# Patient Record
Sex: Male | Born: 1995 | Race: Black or African American | Hispanic: No | Marital: Single | Smoking: Light tobacco smoker
Health system: Southern US, Community
[De-identification: ages and names within clinical notes are randomized; demographics above are authoritative.]

---

## 2016-02-20 ENCOUNTER — Emergency Department (HOSPITAL_COMMUNITY): Payer: BC Managed Care – PPO

## 2016-02-20 ENCOUNTER — Emergency Department (HOSPITAL_COMMUNITY)
Admission: EM | Admit: 2016-02-20 | Discharge: 2016-02-20 | Disposition: A | Discharge status: Home / Self Care | Payer: BC Managed Care – PPO | Attending: Emergency Medicine | Admitting: Emergency Medicine

## 2016-02-20 ENCOUNTER — Encounter (HOSPITAL_COMMUNITY): Payer: Self-pay | Admitting: Emergency Medicine

## 2016-02-20 DIAGNOSIS — S01112A Laceration without foreign body of left eyelid and periocular area, initial encounter: Secondary | ICD-10-CM | POA: Diagnosis not present

## 2016-02-20 DIAGNOSIS — Y999 Unspecified external cause status: Secondary | ICD-10-CM | POA: Insufficient documentation

## 2016-02-20 DIAGNOSIS — Y929 Unspecified place or not applicable: Secondary | ICD-10-CM | POA: Diagnosis not present

## 2016-02-20 DIAGNOSIS — Y939 Activity, unspecified: Secondary | ICD-10-CM | POA: Diagnosis not present

## 2016-02-20 DIAGNOSIS — F17290 Nicotine dependence, other tobacco product, uncomplicated: Secondary | ICD-10-CM | POA: Diagnosis not present

## 2016-02-20 DIAGNOSIS — S0993XA Unspecified injury of face, initial encounter: Secondary | ICD-10-CM | POA: Diagnosis present

## 2016-02-20 DIAGNOSIS — S0512XA Contusion of eyeball and orbital tissues, left eye, initial encounter: Secondary | ICD-10-CM

## 2016-02-20 DIAGNOSIS — S20212A Contusion of left front wall of thorax, initial encounter: Secondary | ICD-10-CM | POA: Insufficient documentation

## 2016-02-20 MED ORDER — LIDOCAINE HCL 2 % IJ SOLN
10.0000 mL | Freq: Once | INTRAMUSCULAR | Status: AC
  Administered 2016-02-20: 200 mg
  Filled 2016-02-20: qty 20

## 2016-02-20 MED ORDER — IBUPROFEN 600 MG PO TABS: 600.0000 mg | ORAL_TABLET | Freq: Four times a day (QID) | ORAL | 0 refills | Status: AC | PRN

## 2016-02-20 MED ORDER — ACETAMINOPHEN 325 MG PO TABS
650.0000 mg | ORAL_TABLET | Freq: Once | ORAL | Status: AC
  Administered 2016-02-20: 650 mg via ORAL
  Filled 2016-02-20: qty 2
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
## 2016-02-20 NOTE — ED Notes (Signed)
[employer] to speak with pt.
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
## 2016-02-20 NOTE — ED Notes (Signed)
PA aware that pt does not want imaging done

## 2016-02-26 ENCOUNTER — Encounter (HOSPITAL_COMMUNITY): Payer: Self-pay | Admitting: *Deleted

## 2016-02-26 ENCOUNTER — Emergency Department (HOSPITAL_COMMUNITY)
Admission: EM | Admit: 2016-02-26 | Discharge: 2016-02-26 | Disposition: A | Discharge status: Home / Self Care | Payer: BC Managed Care – PPO | Attending: Emergency Medicine | Admitting: Emergency Medicine

## 2016-02-26 DIAGNOSIS — Z79899 Other long term (current) drug therapy: Secondary | ICD-10-CM | POA: Diagnosis not present

## 2016-02-26 DIAGNOSIS — Z4802 Encounter for removal of sutures: Secondary | ICD-10-CM | POA: Insufficient documentation

## 2016-02-26 DIAGNOSIS — F17290 Nicotine dependence, other tobacco product, uncomplicated: Secondary | ICD-10-CM | POA: Insufficient documentation

## 2016-02-26 NOTE — ED Provider Notes (Signed)
[facility]-EMERGENCY DEPT Provider Note   CSN: [id number] Arrival date & time: 02/26/16  1537  By signing my name below, I, Kenneth Zhang, attest that this documentation has been prepared under the direction and in the presence of [facility], PA-C.  Electronically Signed: Octavia Zhang, ED Scribe. 02/26/16. 4:05 PM.    History   Chief Complaint Chief Complaint  Patient presents with  . Suture / Staple Removal    The history is provided by the patient. No language interpreter was used.   HPI Comments: Kenneth Zhang is a 21 y.o. male who presents to the Emergency Department presenting for a suture removal. Pt had 4 sutures placed to his left eyebrow on 01/07. Pt expresses having intermittent headaches that have progressively been getting better. He notes the area has been healing well with no drainage, bleeding, or erythema from the area. He has been cleaning the area with rubbing alcohol but notes he has not had any complications with the healing of his laceration. Pt denies numbness or weakness in arms or legs and visual disturbance.  History reviewed. No pertinent past medical history.  There are no active problems to display for this patient.   History reviewed. No pertinent surgical history.     Home Medications    Prior to Admission medications   Medication Sig Start Date End Date Taking? Authorizing Provider  ibuprofen (ADVIL,MOTRIN) 600 MG tablet Take 1 tablet (600 mg total) by mouth every 6 (six) hours as needed. 02/20/16   Antony Madura, PA-C    Family History No family history on file.  Social History Social History  Substance Use Topics  . Smoking status: Light Tobacco Smoker    Types: Cigars  . Smokeless tobacco: Never Used  . Alcohol use No     Allergies   Patient has no known allergies.   Review of Systems Review of Systems  Constitutional: Negative for activity change, appetite change and fever.  Skin: Positive for wound. Negative for color change.    Allergic/Immunologic: Negative for immunocompromised state.  Neurological: Negative for weakness and numbness.  Hematological: Does not bruise/bleed easily.  Psychiatric/Behavioral: Negative for self-injury.     Physical Exam Updated Vital Signs BP 135/76 (BP Location: Right Arm)   Pulse 66   Temp 98.1 F (36.7 C) (Oral)   Resp 16   SpO2 100%   Physical Exam  Constitutional: He appears well-developed and well-nourished. No distress.  HENT:  Head: Normocephalic.  Mouth/Throat: Oropharynx is clear and moist. No oropharyngeal exudate.  Left eyebrow with healing laceration. 4 sutures removed by nurse. No erythema, edema, warmth, discharge, or tenderness  Eyes: Conjunctivae and EOM are normal. Right eye exhibits no discharge. Left eye exhibits no discharge.  Neck: Normal range of motion. Neck supple.  Cardiovascular: Normal rate and regular rhythm.   Pulmonary/Chest: Effort normal and breath sounds normal. No stridor. No respiratory distress. He has no wheezes. He has no rales.  Lymphadenopathy:    He has no cervical adenopathy.  Neurological: He is alert.  CN II-XII intact, EOMs intact, no pronator drift, grip strengths equal bilaterally; strength 5/5 in all extremities, sensation intact in all extremities; finger to nose, heel to shin, rapid alternating movements normal; gait is normal.   Skin: He is not diaphoretic.  Nursing note and vitals reviewed.    ED Treatments / Results  DIAGNOSTIC STUDIES: Oxygen Saturation is 100% on RA, normal by my interpretation.  COORDINATION OF CARE:  4:02 PM Discussed treatment plan with pt at bedside  and pt agreed to plan.  Labs (all labs ordered are listed, but only abnormal results are displayed) Labs Reviewed - No data to display  EKG  EKG Interpretation None       Radiology No results found.  Procedures Procedures (including critical care time)  Medications Ordered in ED Medications - No data to display   Initial  Impression / Assessment and Plan / ED Course  I have reviewed the triage vital signs and the nursing notes.  Pertinent labs & imaging results that were available during my care of the patient were reviewed by me and considered in my medical decision making (see chart for details).  Clinical Course     Afebrile, nontoxic patient with well healing wound over left eyebrow.  No e/o infection.  Has had headaches that are improving.  No neurologic deficits.  Pt did decline head CT at last visit but has normal neuro exam and reports improving condition, no need for emergent imaging at this time.    D/C home with wound care instructions.  Discussed result, findings, treatment, and follow up  with patient.  Pt given return precautions.  Pt verbalizes understanding and agrees with plan.       Final Clinical Impressions(s) / ED Diagnoses   Final diagnoses:  Visit for suture removal   I personally performed the services described in this documentation, which was scribed in my presence. The recorded information has been reviewed and is accurate.  New Prescriptions Discharge Medication List as of 02/26/2016  4:01 PM       Trixie Dredgemily Lauryn Lizardi, PA-C 02/26/16 1626    Nelva Nayobert Beaton, MD 02/27/16 1350

## 2016-02-26 NOTE — ED Triage Notes (Signed)
Pt here to have 4 sutures removed from his left eyebrow.  Incision appears healed and sutures have been in place for 6 days.

## 2016-02-26 NOTE — Discharge Instructions (Signed)
Read the information below.  You may return to the Emergency Department at any time for worsening condition or any new symptoms that concern you.  If you develop redness, swelling, pus draining from the wound, or fevers greater than 100.4, return to the ER immediately for a recheck.   °
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
